# Patient Record
Sex: Female | Born: 1985 | Race: Black or African American | Hispanic: No | Marital: Single
Health system: Southern US, Community
[De-identification: ages and names within clinical notes are randomized; demographics above are authoritative.]

## PROBLEM LIST (undated history)

## (undated) DIAGNOSIS — E119 Type 2 diabetes mellitus without complications: Secondary | ICD-10-CM

## (undated) HISTORY — PX: NO PAST SURGERIES: SHX2092

---

## 2015-09-27 ENCOUNTER — Inpatient Hospital Stay (HOSPITAL_COMMUNITY): Payer: Self-pay

## 2015-09-27 ENCOUNTER — Inpatient Hospital Stay (HOSPITAL_COMMUNITY)
Admission: AD | Admit: 2015-09-27 | Discharge: 2015-09-27 | Disposition: A | Discharge status: Home / Self Care | Payer: 59 | Source: Ambulatory Visit | Attending: Obstetrics and Gynecology | Admitting: Obstetrics and Gynecology

## 2015-09-27 ENCOUNTER — Encounter (HOSPITAL_COMMUNITY): Payer: Self-pay

## 2015-09-27 DIAGNOSIS — N83201 Unspecified ovarian cyst, right side: Secondary | ICD-10-CM | POA: Insufficient documentation

## 2015-09-27 DIAGNOSIS — B3749 Other urogenital candidiasis: Secondary | ICD-10-CM

## 2015-09-27 DIAGNOSIS — N760 Acute vaginitis: Secondary | ICD-10-CM | POA: Diagnosis not present

## 2015-09-27 DIAGNOSIS — B373 Candidiasis of vulva and vagina: Secondary | ICD-10-CM | POA: Diagnosis not present

## 2015-09-27 DIAGNOSIS — A499 Bacterial infection, unspecified: Secondary | ICD-10-CM

## 2015-09-27 DIAGNOSIS — IMO0001 Reserved for inherently not codable concepts without codable children: Secondary | ICD-10-CM

## 2015-09-27 DIAGNOSIS — R109 Unspecified abdominal pain: Secondary | ICD-10-CM

## 2015-09-27 DIAGNOSIS — E1165 Type 2 diabetes mellitus with hyperglycemia: Secondary | ICD-10-CM | POA: Insufficient documentation

## 2015-09-27 DIAGNOSIS — B9689 Other specified bacterial agents as the cause of diseases classified elsewhere: Secondary | ICD-10-CM

## 2015-09-27 HISTORY — DX: Type 2 diabetes mellitus without complications: E11.9

## 2015-09-27 LAB — LIPASE, BLOOD: LIPASE: 25 U/L (ref 11–51)

## 2015-09-27 LAB — URINALYSIS, ROUTINE W REFLEX MICROSCOPIC
BILIRUBIN URINE: NEGATIVE
Glucose, UA: >1000 mg/dL — AB
KETONES UR: 15 mg/dL — AB
Leukocytes, UA: NEGATIVE
NITRITE: NEGATIVE
PH: 6 (ref 5.0–8.0)
Protein, ur: NEGATIVE mg/dL
Specific Gravity, Urine: 1.01 (ref 1.005–1.030)

## 2015-09-27 LAB — CBC WITH DIFFERENTIAL/PLATELET
Basophils Absolute: 0 10*3/uL (ref 0.0–0.1)
Basophils Relative: 0 %
EOS PCT: 0 %
Eosinophils Absolute: 0 10*3/uL (ref 0.0–0.7)
HCT: 38.8 % (ref 36.0–46.0)
HEMOGLOBIN: 12.8 g/dL (ref 12.0–15.0)
LYMPHS ABS: 2.2 10*3/uL (ref 0.7–4.0)
LYMPHS PCT: 28 %
MCH: 30.7 pg (ref 26.0–34.0)
MCHC: 33 g/dL (ref 30.0–36.0)
MCV: 93 fL (ref 78.0–100.0)
Monocytes Absolute: 0.5 10*3/uL (ref 0.1–1.0)
Monocytes Relative: 6 %
NEUTROS PCT: 66 %
Neutro Abs: 5.1 10*3/uL (ref 1.7–7.7)
Platelets: 291 10*3/uL (ref 150–400)
RBC: 4.17 MIL/uL (ref 3.87–5.11)
RDW: 13.1 % (ref 11.5–15.5)
WBC: 7.7 10*3/uL (ref 4.0–10.5)

## 2015-09-27 LAB — COMPREHENSIVE METABOLIC PANEL
ALK PHOS: 41 U/L (ref 38–126)
ALT: 13 U/L — AB (ref 14–54)
AST: 12 U/L — ABNORMAL LOW (ref 15–41)
Albumin: 4 g/dL (ref 3.5–5.0)
Anion gap: 4 — ABNORMAL LOW (ref 5–15)
BUN: 14 mg/dL (ref 6–20)
CALCIUM: 9.1 mg/dL (ref 8.9–10.3)
CO2: 26 mmol/L (ref 22–32)
Chloride: 107 mmol/L (ref 101–111)
Creatinine, Ser: 0.62 mg/dL (ref 0.44–1.00)
Glucose, Bld: 368 mg/dL — ABNORMAL HIGH (ref 65–99)
Potassium: 4.5 mmol/L (ref 3.5–5.1)
Sodium: 137 mmol/L (ref 135–145)
Total Bilirubin: 0.7 mg/dL (ref 0.3–1.2)
Total Protein: 7.5 g/dL (ref 6.5–8.1)

## 2015-09-27 LAB — URINE MICROSCOPIC-ADD ON

## 2015-09-27 LAB — WET PREP, GENITAL
Sperm: NONE SEEN
TRICH WET PREP: NONE SEEN
YEAST WET PREP: NONE SEEN

## 2015-09-27 LAB — AMYLASE: Amylase: 100 U/L (ref 28–100)

## 2015-09-27 LAB — POCT PREGNANCY, URINE: Preg Test, Ur: NEGATIVE

## 2015-09-27 MED ORDER — OXYCODONE-ACETAMINOPHEN 5-325 MG PO TABS: 1.0000 | ORAL_TABLET | Freq: Four times a day (QID) | ORAL | Status: DC | PRN

## 2015-09-27 MED ORDER — OXYCODONE-ACETAMINOPHEN 5-325 MG PO TABS: 1.0000 | ORAL_TABLET | Freq: Four times a day (QID) | ORAL | Status: AC | PRN

## 2015-09-27 MED ORDER — FLUCONAZOLE 150 MG PO TABS: ORAL_TABLET | ORAL | Status: AC

## 2015-09-27 MED ORDER — ACETAMINOPHEN 500 MG PO TABS: 1000.0000 mg | ORAL_TABLET | Freq: Once | ORAL | Status: DC

## 2015-09-27 MED ORDER — METRONIDAZOLE 500 MG PO TABS: 500.0000 mg | ORAL_TABLET | Freq: Two times a day (BID) | ORAL | Status: AC

## 2015-09-27 NOTE — Discharge Instructions (Signed)
Disposable Sitz Bath A disposable sitz bath is a plastic basin that fits over the toilet. A bag is hung above the toilet and is connected to a tube that opens into the disposable sitz bath. The bag is filled with warm water that can flow into the basin through the tube.  HOW TO USE A DISPOSABLE SITZ BATH 1. Close the clamp on the tubing before filling the bag with water. This is to prevent leakage. 2. Fill the sitz bath basin and the plastic bag with warm water. 3. Place the filled basin on the toilet with the seat raised. Make sure the overflow opening is facing toward the back of the toilet. 4. Hang the filled plastic bag overhead on a hook or towel rack close to the toilet. When the bag is unclamped, a steady stream of water will flow from the bag, through the tubing, and into the basin. 5. Attach the tubing to the opening on the basin. 6. Sit on the basin positioned on the toilet seat and release the clamp. This will allow warm water to flush the area around your genitals and anus (perineum). 7. Remain sitting on the basin for approximately 15 to 20 minutes. 8. Stand up and pat the perineum area dry. If needed, apply clean bandages (dressings) to the affected area. 9. Tip the basin into the toilet to remove any remaining water and flush the toilet. 10. Wash the basin with warm water and soap. Let it dry in the sink. 11. Store the basin and tubing in a clean, dry area. 12. Wash your hands with soap and water. SEEK MEDICAL CARE IF: You get worse instead of better. Stop the sitz baths if you get worse. MAKE SURE YOU:  Understand these instructions.  Will watch your condition.  Will get help right away if you are not doing well or get worse.   This information is not intended to replace advice given to you by your health care provider. Make sure you discuss any questions you have with your health care provider.   Document Released: 11/15/2011 Document Revised: 02/08/2012 Document Reviewed:  11/15/2011 Elsevier Interactive Patient Education 2016 Elsevier Inc. Bacterial Vaginosis Bacterial vaginosis is a vaginal infection that occurs when the normal balance of bacteria in the vagina is disrupted. It results from an overgrowth of certain bacteria. This is the most common vaginal infection in women of childbearing age. Treatment is important to prevent complications, especially in pregnant women, as it can cause a premature delivery. CAUSES  Bacterial vaginosis is caused by an increase in harmful bacteria that are normally present in smaller amounts in the vagina. Several different kinds of bacteria can cause bacterial vaginosis. However, the reason that the condition develops is not fully understood. RISK FACTORS Certain activities or behaviors can put you at an increased risk of developing bacterial vaginosis, including: 30. Having a new sex partner or multiple sex partners. 14. Douching. 15. Using an intrauterine device (IUD) for contraception. Women do not get bacterial vaginosis from toilet seats, bedding, swimming pools, or contact with objects around them. SIGNS AND SYMPTOMS  Some women with bacterial vaginosis have no signs or symptoms. Common symptoms include:  Grey vaginal discharge.  A fishlike odor with discharge, especially after sexual intercourse.  Itching or burning of the vagina and vulva.  Burning or pain with urination. DIAGNOSIS  Your health care provider will take a medical history and examine the vagina for signs of bacterial vaginosis. A sample of vaginal fluid may be taken.  Your health care provider will look at this sample under a microscope to check for bacteria and abnormal cells. A vaginal pH test may also be done.  TREATMENT  Bacterial vaginosis may be treated with antibiotic medicines. These may be given in the form of a pill or a vaginal cream. A second round of antibiotics may be prescribed if the condition comes back after treatment. Because  bacterial vaginosis increases your risk for sexually transmitted diseases, getting treated can help reduce your risk for chlamydia, gonorrhea, HIV, and herpes. HOME CARE INSTRUCTIONS   Only take over-the-counter or prescription medicines as directed by your health care provider.  If antibiotic medicine was prescribed, take it as directed. Make sure you finish it even if you start to feel better.  Tell all sexual partners that you have a vaginal infection. They should see their health care provider and be treated if they have problems, such as a mild rash or itching.  During treatment, it is important that you follow these instructions:  Avoid sexual activity or use condoms correctly.  Do not douche.  Avoid alcohol as directed by your health care provider.  Avoid breastfeeding as directed by your health care provider. SEEK MEDICAL CARE IF:   Your symptoms are not improving after 3 days of treatment.  You have increased discharge or pain.  You have a fever. MAKE SURE YOU:   Understand these instructions.  Will watch your condition.  Will get help right away if you are not doing well or get worse. FOR MORE INFORMATION  Centers for Disease Control and Prevention, Division of STD Prevention: [employer] American Sexual Health Association (ASHA): www.ashastd.org    This information is not intended to replace advice given to you by your health care provider. Make sure you discuss any questions you have with your health care provider.   Document Released: 05/16/2005 Document Revised: 06/06/2014 Document Reviewed: 12/26/2012 Elsevier Interactive Patient Education 2016 Elsevier Inc. Ovarian Tumors The ovaries are small organs that produce eggs in women. They lie on each side of the uterus. Tumors are solid growths on the ovary, not like ovarian cysts that are filled with fluid. They can be cancerous or noncancerous. All solid tumors should be looked at to make sure they are not  cancerous tumors.  RISK FACTORS There are no known causes for ovarian tumors. However, there are several risk factors for developing cancerous tumors on the ovary, such as: 45. Age. 30. [place] or [place] descent. 73. Personal or family history of ovarian, colon, or breast cancer. 19. Presence of BRCA1 or BRCA2 genes. 20. Use of fertility medicines. 21. Late menopause (older than 50 years). 14. Pregnancy for the first time at age 25 years or older. SIGNS AND SYMPTOMS  In many cases there are no symptoms. Noncancerous tumors usually have no symptoms, but cancerous tumors may have symptoms that are minor and resemble other health problems. The following are symptoms of ovarian tumors:  Unexplained weight loss.  Increased abdominal size.  Belly (abdominal) pain.  Back or pelvis pain or pressure.  Tiredness.  Abnormal vaginal bleeding.  Loss of appetite.  Frequent urination or pressure on your bladder.  Indigestion, increased gas, and bloating.  Painful sexual intercourse. DIAGNOSIS  The following test may be done to help diagnose ovarian tumors:  An ultrasound exam.  Imaging exams, such as an X-ray exam, CT scan, or MRI.  Blood tests. TREATMENT   The tumor will be studied in the lab under a microscope to  see if it is cancer.  Noncancerous tumors can be removed surgically with or without removing the ovary.  Cancerous tumors usually are removed with the ovary and sometimes both ovaries are removed with the fallopian tubes, uterus, and surrounding lymph nodes to see if the cancer has spread.  Cancerous tumors may also be treated along with surgery and radiation, chemotherapy, or both. HOME CARE INSTRUCTIONS   Follow your health care provider's advice and recommendations regarding medicine and follow-up care.  Get a yearly physical and gynecology exam. This includes a pelvic exam if you are aged 22 years or older. SEEK MEDICAL CARE IF:   You have any  of the above symptoms that have not gone away after a week of treatment.  You are losing weight for no reason.  You feel generally ill.   This information is not intended to replace advice given to you by your health care provider. Make sure you discuss any questions you have with your health care provider.   Document Released: 02/23/2008 Document Revised: 03/06/2013 Document Reviewed: 12/13/2012 Elsevier Interactive Patient Education [employer].

## 2015-09-27 NOTE — MAU Provider Note (Signed)
History     CSN: [id number]  Arrival date and time: 09/27/15 [id number]   First Provider Initiated Contact with Patient 09/27/15 1948      Chief Complaint  Patient presents with  . Abdominal Pain   HPI Comments: Paige Gonzalez is a 30 y.o. diabetic nulligravida presenting with right lower abdominal pain. [place] and has not established care here. She has a stressful new job. Not taking her insulin due to financial problems, but is taking metformin (unsure of dose "4 tabs twice a day"). Not checking blood sugars. Unsure of insulin dose and has not taken it for 2 months or more.  Gets frequent yeast infections and thinks she has a yeast infection now. LMP 09/20/15, now on last day of menses.    Abdominal Pain This is a new problem. The current episode started in the past 7 days (3 days ago began with sharp intermittent right mid and lower abdominal pains). The onset quality is gradual. The problem occurs intermittently. The problem has been gradually worsening. The pain is located in the RLQ and periumbilical region (pain has moved to suprapubic abdomen). The pain is at a severity of 9/10. The pain is moderate. The quality of the pain is sharp and cramping. The abdominal pain does not radiate. Associated symptoms include anorexia, frequency and vomiting. Pertinent negatives include no constipation, diarrhea, dysuria, fever or nausea. Associated symptoms comments: Vomited once after eating lunch today and has not eaten since. . The pain is aggravated by movement. She has tried nothing for the symptoms. There is no history of abdominal surgery.     Past Medical History  Diagnosis Date  . Diabetes mellitus without complication (HCC)     metformin    Past Surgical History  Procedure Laterality Date  . No past surgeries      History reviewed. No pertinent family history.  Social History  Substance Use Topics  . Smoking status: None  . Smokeless tobacco:  None  . Alcohol Use: None    Allergies: No Known Allergies  No prescriptions prior to admission    Review of Systems  Constitutional: Negative for fever.  Gastrointestinal: Positive for vomiting, abdominal pain and anorexia. Negative for nausea, diarrhea and constipation.  Genitourinary: Positive for frequency. Negative for dysuria.   Physical Exam   Blood pressure 114/79, pulse 87, temperature 98.4 F (36.9 C), temperature source Oral, resp. rate 20, height 5\' 11"  (1.803 m), weight 107.956 kg (238 lb), last menstrual period 09/20/2015, SpO2 99 %.  Physical Exam  Nursing note and vitals reviewed. Constitutional: She is oriented to person, place, and time. She appears well-developed and well-nourished. She appears distressed.  HENT:  Head: Normocephalic.  Eyes: Conjunctivae are normal.  Neck: Normal range of motion. Neck supple. No thyromegaly present.  Cardiovascular: Normal rate and regular rhythm.   No murmur heard. Respiratory: Effort normal and breath sounds normal. No respiratory distress.  GI: Soft. She exhibits no distension and no mass. There is tenderness. There is rebound and guarding.  BS diminished in lower quadrants   Genitourinary: Vagina normal. No vaginal discharge found.  Pelvic exam: Vulvar and perineal pink confluent rash, no erythema or swelling Vagina with scant white discharge, slight amount dark blood on Q-tip Cx nulliparous, clean Bimanual exam poorly tolerated; CMT not noted, unable to outliine uterus or palpate ovaries due to guarding  Musculoskeletal: She exhibits no edema.  Neurological: She is alert and oriented to person, place, and time.  Skin:  Skin is warm and dry.  Psychiatric: She has a normal mood and affect. Her behavior is normal. Thought content normal.    MAU Course  Procedures Results for orders placed or performed during the hospital encounter of 09/27/15 (from the past 24 hour(s))  Urinalysis, Routine w reflex microscopic (not at  [facility])     Status: Abnormal   Collection Time: 09/27/15  7:25 PM  Result Value Ref Range   Color, Urine YELLOW YELLOW   APPearance CLEAR CLEAR   Specific Gravity, Urine 1.010 1.005 - 1.030   pH 6.0 5.0 - 8.0   Glucose, UA >1000 (A) NEGATIVE mg/dL   Hgb urine dipstick LARGE (A) NEGATIVE   Bilirubin Urine NEGATIVE NEGATIVE   Ketones, ur 15 (A) NEGATIVE mg/dL   Protein, ur NEGATIVE NEGATIVE mg/dL   Nitrite NEGATIVE NEGATIVE   Leukocytes, UA NEGATIVE NEGATIVE  Urine microscopic-add on     Status: Abnormal   Collection Time: 09/27/15  7:25 PM  Result Value Ref Range   Squamous Epithelial / LPF 0-5 (A) NONE SEEN   WBC, UA 0-5 0 - 5 WBC/hpf   RBC / HPF 6-30 0 - 5 RBC/hpf   Bacteria, UA FEW (A) NONE SEEN  Pregnancy, urine POC     Status: None   Collection Time: 09/27/15  7:37 PM  Result Value Ref Range   Preg Test, Ur NEGATIVE NEGATIVE  Wet prep, genital     Status: Abnormal   Collection Time: 09/27/15  8:05 PM  Result Value Ref Range   Yeast Wet Prep HPF POC NONE SEEN NONE SEEN   Trich, Wet Prep NONE SEEN NONE SEEN   Clue Cells Wet Prep HPF POC PRESENT (A) NONE SEEN   WBC, Wet Prep HPF POC MODERATE (A) NONE SEEN   Sperm NONE SEEN   CBC with Differential/Platelet     Status: None   Collection Time: 09/27/15  8:14 PM  Result Value Ref Range   WBC 7.7 4.0 - 10.5 K/uL   RBC 4.17 3.87 - 5.11 MIL/uL   Hemoglobin 12.8 12.0 - 15.0 g/dL   HCT 81.1 91.4 - 78.2 %   MCV 93.0 78.0 - 100.0 fL   MCH 30.7 26.0 - 34.0 pg   MCHC 33.0 30.0 - 36.0 g/dL   RDW 95.6 21.3 - 08.6 %   Platelets 291 150 - 400 K/uL   Neutrophils Relative % 66 %   Neutro Abs 5.1 1.7 - 7.7 K/uL   Lymphocytes Relative 28 %   Lymphs Abs 2.2 0.7 - 4.0 K/uL   Monocytes Relative 6 %   Monocytes Absolute 0.5 0.1 - 1.0 K/uL   Eosinophils Relative 0 %   Eosinophils Absolute 0.0 0.0 - 0.7 K/uL   Basophils Relative 0 %   Basophils Absolute 0.0 0.0 - 0.1 K/uL  Comprehensive metabolic panel     Status: Abnormal   Collection  Time: 09/27/15  8:14 PM  Result Value Ref Range   Sodium 137 135 - 145 mmol/L   Potassium 4.5 3.5 - 5.1 mmol/L   Chloride 107 101 - 111 mmol/L   CO2 26 22 - 32 mmol/L   Glucose, Bld 368 (H) 65 - 99 mg/dL   BUN 14 6 - 20 mg/dL   Creatinine, Ser 5.78 0.44 - 1.00 mg/dL   Calcium 9.1 8.9 - 46.9 mg/dL   Total Protein 7.5 6.5 - 8.1 g/dL   Albumin 4.0 3.5 - 5.0 g/dL   AST 12 (L) 15 - 41 U/L  ALT 13 (L) 14 - 54 U/L   Alkaline Phosphatase 41 38 - 126 U/L   Total Bilirubin 0.7 0.3 - 1.2 mg/dL   GFR calc non Af Amer >60 >60 mL/min   GFR calc Af Amer >60 >60 mL/min   Anion gap 4 (L) 5 - 15  Lipase, blood     Status: None   Collection Time: 09/27/15  8:14 PM  Result Value Ref Range   Lipase 25 11 - 51 U/L  Amylase     Status: None   Collection Time: 09/27/15  8:14 PM  Result Value Ref Range   Amylase 100 28 - 100 U/L  US Transvaginal Non-ob  09/27/2015  CLINICAL DATA:  Pelvic pain. EXAM: TRANSABDOMINAL AND TRANSVAGINAL ULTRASOUND OF PELVIS TECHNIQUE: Both transabdominal and transvaginal ultrasound examinations of the pelvis were performed. Transabdominal technique was performed for global imaging of the pelvis including uterus, ovaries, adnexal regions, and pelvic cul-de-sac. It was necessary to proceed with endovaginal exam following the transabdominal exam to visualize the endometrium and ovaries. COMPARISON:  None FINDINGS: Uterus Measurements: 7.3 x 4.9 x 3.1 cm. No fibroids or other mass visualized. Endometrium Thickness: 6.6 mm which is within normal limits for patient of reproductive age. No focal abnormality visualized. Right ovary Measurements: 6.3 x 4.2 x 3.9 cm. Complex lesion is noted in right ovary measuring 5.0 x 3.7 x 3.5 cm. Left ovary Measurements: 4.0 x 3.2 x 0.9 cm. Normal appearance/no adnexal mass. Other findings No abnormal free fluid. IMPRESSION: 5 cm complex lesion seen in right ovary which may represent hemorrhagic cyst or endometrioma. Follow-up ultrasound in 6-12 weeks is  recommended to ensure resolution and rule out neoplasm. Electronically Signed   By: Lupita Raider, M.D.   On: 09/27/2015 21:04   US Pelvis Complete  09/27/2015  CLINICAL DATA:  Pelvic pain. EXAM: TRANSABDOMINAL AND TRANSVAGINAL ULTRASOUND OF PELVIS TECHNIQUE: Both transabdominal and transvaginal ultrasound examinations of the pelvis were performed. Transabdominal technique was performed for global imaging of the pelvis including uterus, ovaries, adnexal regions, and pelvic cul-de-sac. It was necessary to proceed with endovaginal exam following the transabdominal exam to visualize the endometrium and ovaries. COMPARISON:  None FINDINGS: Uterus Measurements: 7.3 x 4.9 x 3.1 cm. No fibroids or other mass visualized. Endometrium Thickness: 6.6 mm which is within normal limits for patient of reproductive age. No focal abnormality visualized. Right ovary Measurements: 6.3 x 4.2 x 3.9 cm. Complex lesion is noted in right ovary measuring 5.0 x 3.7 x 3.5 cm. Left ovary Measurements: 4.0 x 3.2 x 0.9 cm. Normal appearance/no adnexal mass. Other findings No abnormal free fluid. IMPRESSION: 5 cm complex lesion seen in right ovary which may represent hemorrhagic cyst or endometrioma. Follow-up ultrasound in 6-12 weeks is recommended to ensure resolution and rule out neoplasm. Electronically Signed   By: Lupita Raider, M.D.   On: 09/27/2015 21:04    Declined analgesic while in MAU   Pt elects to leave before I could discuss with Dr. Emelda Fear who is in OR.      Assessment and Plan   1. Uncontrolled diabetes mellitus type 2 without complications, unspecified long term insulin use status (HCC)   2. Abdominal pain, acute   3. BV (bacterial vaginosis)   4. Candida infection of genital region   5. Cyst of right ovary    Follow-up Information    Follow up with [facility]. Schedule an appointment as soon as possible for a visit in 1  week.   Contact information:   [address]  [place] [address] [phone]      Follow up with [facility].   Specialty:  Obstetrics and Gynecology   Why:  Someone from Clinic will call you with appt.   Contact information:   [address] [place] [address] [phone]    Will go to [facility] for DM management asap Reviewed maximum acetaminophen dose 4 gm/24hr (includes Percocet) For [place] in 6wks for F/U scan. WOC for results  POE,DEIRDRE 09/27/2015, 7:49 PM

## 2015-09-27 NOTE — MAU Note (Signed)
Pt here with abdominal pain that started a couple of days ago on the lower right side, now in the lower middle of her abdomen. States her parents have a hx of kidney stones, and "wonders if that's what's wrong."

## 2015-09-28 LAB — GC/CHLAMYDIA PROBE AMP (~~LOC~~) NOT AT ARMC
CHLAMYDIA, DNA PROBE: NEGATIVE
Neisseria Gonorrhea: NEGATIVE

## 2015-09-28 LAB — HIV ANTIBODY (ROUTINE TESTING W REFLEX): HIV SCREEN 4TH GENERATION: NONREACTIVE

## 2015-09-29 ENCOUNTER — Telehealth: Payer: Self-pay | Admitting: General Practice

## 2015-09-29 DIAGNOSIS — N83201 Unspecified ovarian cyst, right side: Secondary | ICD-10-CM

## 2015-09-29 NOTE — Telephone Encounter (Signed)
Per Deirdre, patient needs u/s in 6 weeks and follow up visit in the office for ovarian cyst. Patient also needs PCP for uncontrolled diabetes. U/s scheduled for 6/12 @ 1pm. Called patient and informed her of appt and that someone from the office would contact her to schedule f/u appt in our office. Also discussed importance of PCP and provided patient several numbers for providers in the area. Patient verbalized understanding to all & had no questions

## 2015-11-09 ENCOUNTER — Ambulatory Visit (HOSPITAL_COMMUNITY): Payer: Commercial Managed Care - HMO

## 2015-11-11 ENCOUNTER — Encounter: Payer: 59 | Admitting: Obstetrics & Gynecology

## 2015-11-12 ENCOUNTER — Ambulatory Visit (HOSPITAL_COMMUNITY)
Admission: RE | Admit: 2015-11-12 | Discharge: 2015-11-12 | Disposition: A | Discharge status: Home / Self Care | Payer: Commercial Managed Care - HMO | Source: Ambulatory Visit | Attending: Obstetrics and Gynecology | Admitting: Obstetrics and Gynecology

## 2015-11-12 DIAGNOSIS — N83201 Unspecified ovarian cyst, right side: Secondary | ICD-10-CM | POA: Diagnosis present

## 2015-11-18 ENCOUNTER — Telehealth: Payer: Self-pay | Admitting: General Practice

## 2015-11-18 NOTE — Telephone Encounter (Signed)
Called patient with ultrasound results. Patient verbalized understanding & states she would like to make an appt in our office but will call back to schedule that. Patient had no questions

## 2017-10-20 IMAGING — US US PELVIS COMPLETE
1 series · 15 of 25 positions shown · non-contrast
Comparison: None

CLINICAL DATA: Pelvic pain.

EXAM:
TRANSABDOMINAL AND TRANSVAGINAL ULTRASOUND OF PELVIS
TECHNIQUE: Both transabdominal and transvaginal ultrasound examinations of the
pelvis were performed. Transabdominal technique was performed for
global imaging of the pelvis including uterus, ovaries, adnexal
regions, and pelvic cul-de-sac. It was necessary to proceed with
endovaginal exam following the transabdominal exam to visualize the
endometrium and ovaries.

[Series 1: us pelvis complete · 15 of 61 slices shown]
[im 1/61]
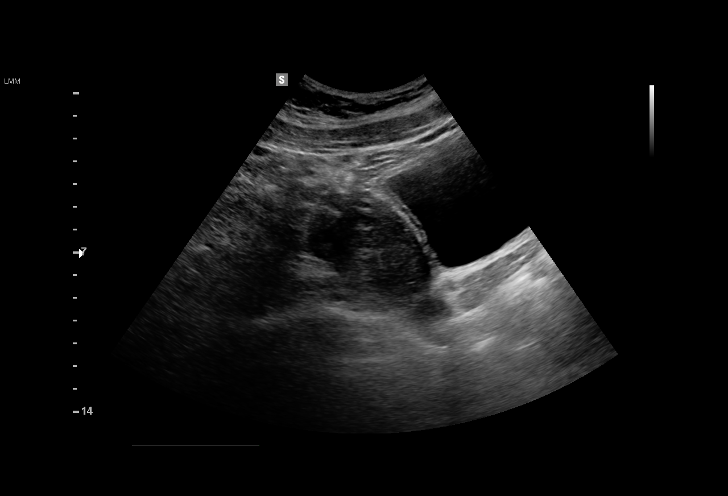
[im 6/61]
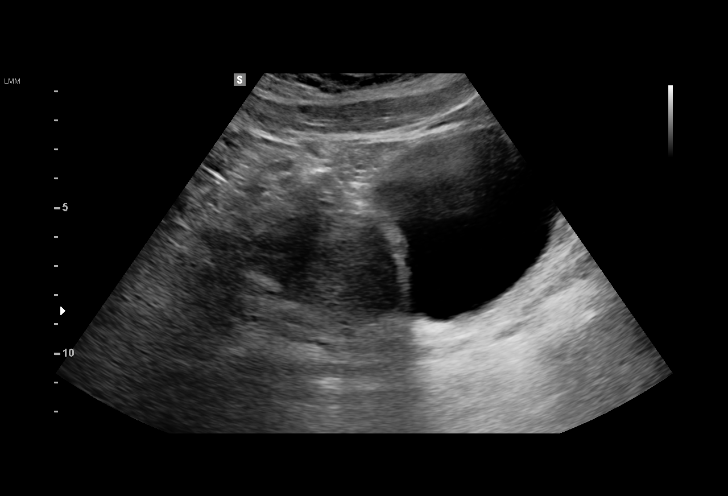
[im 11/61]
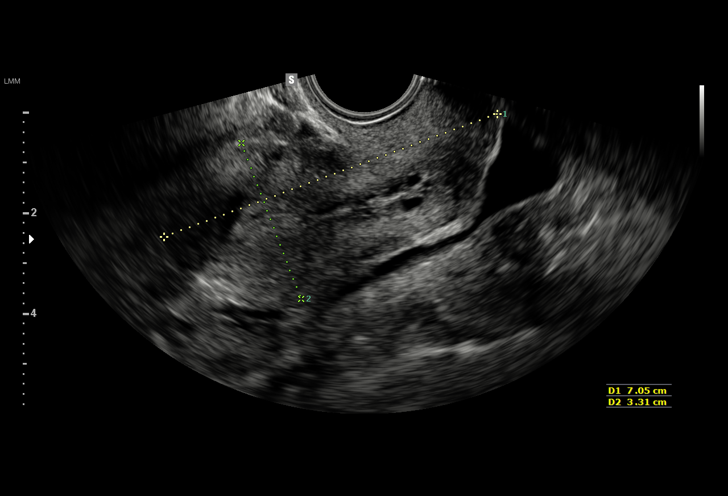
[im 13/61]
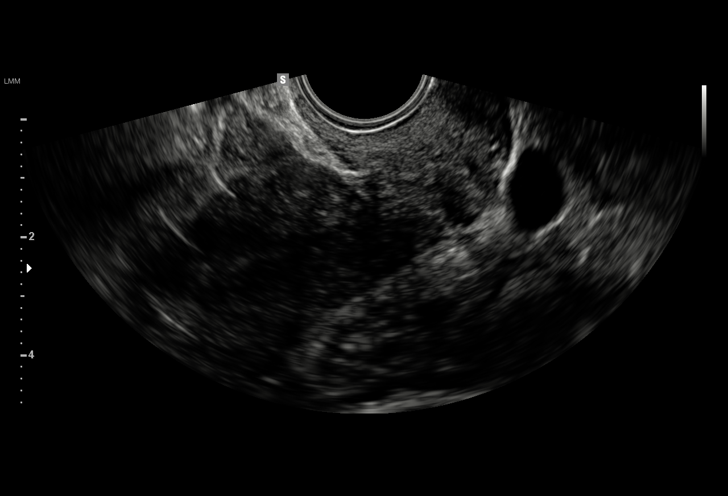
[im 18/61]
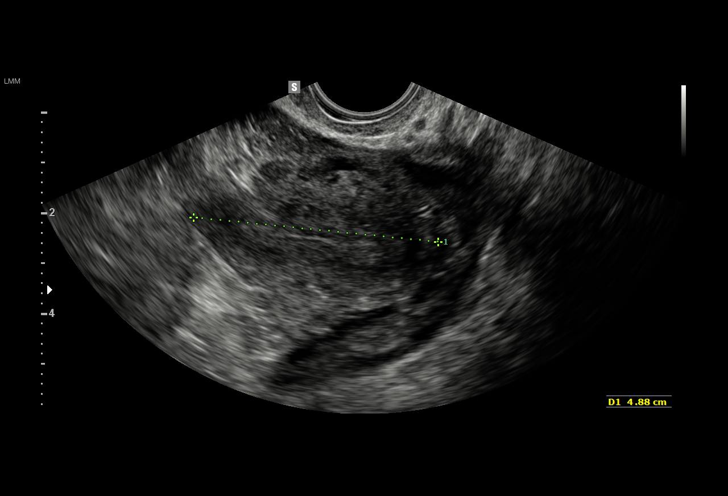
[im 23/61]
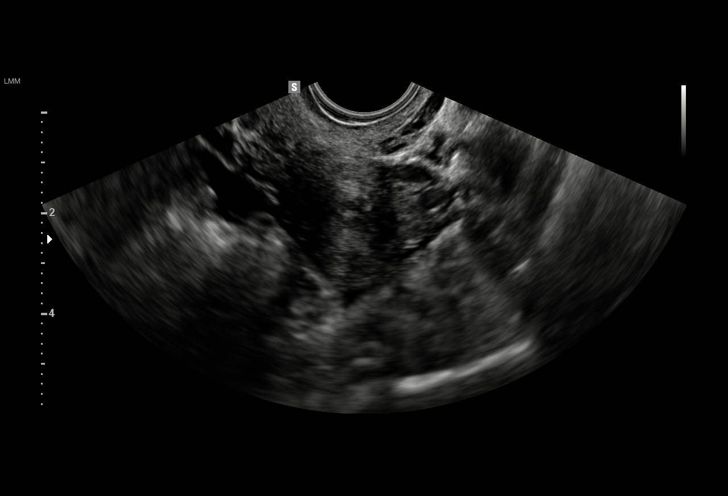
[im 26/61]
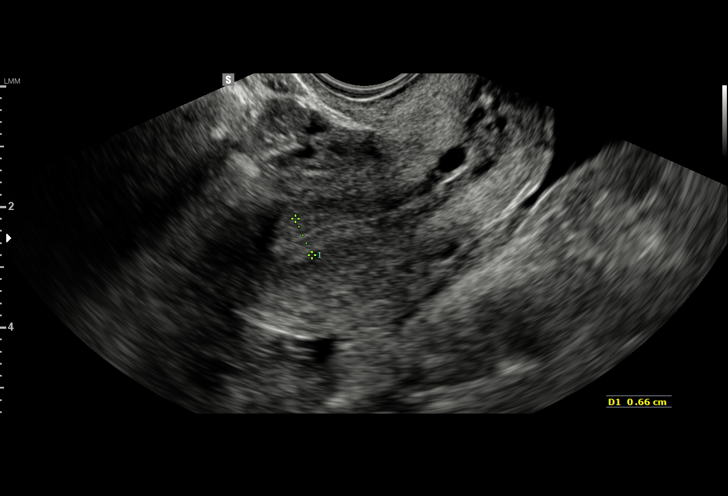
[im 31/61]
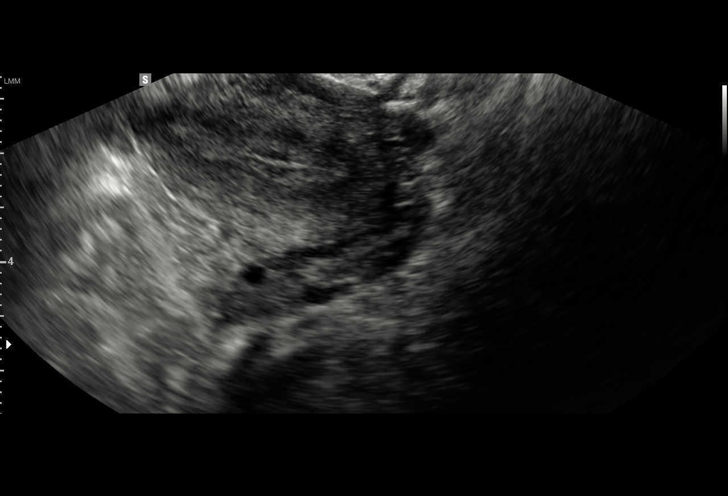
[im 36/61]
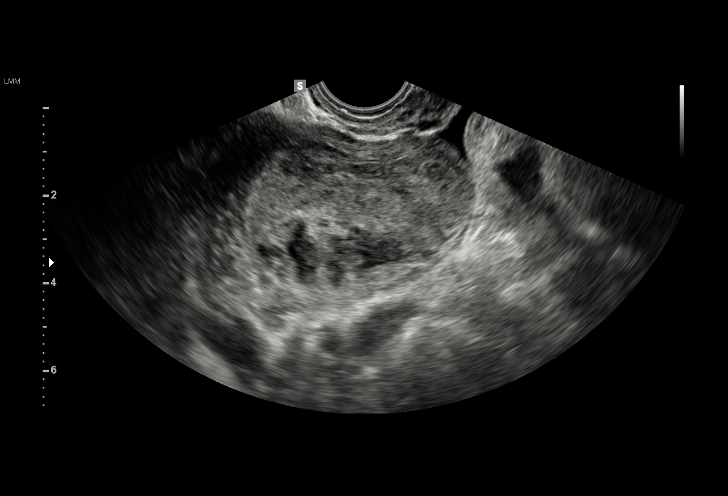
[im 38/61]
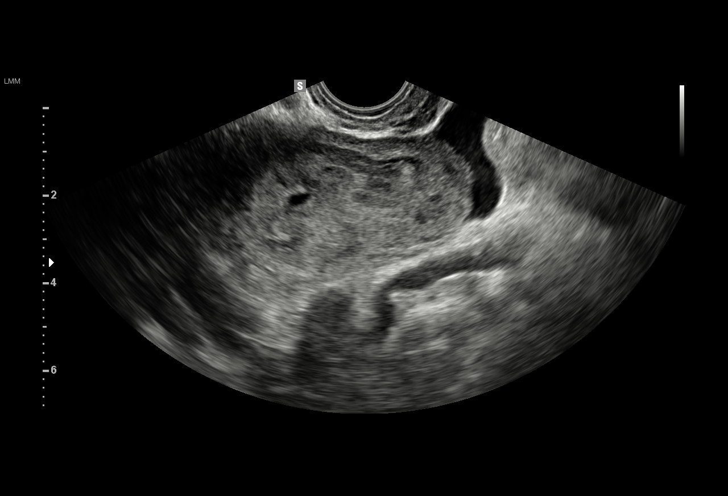
[im 43/61]
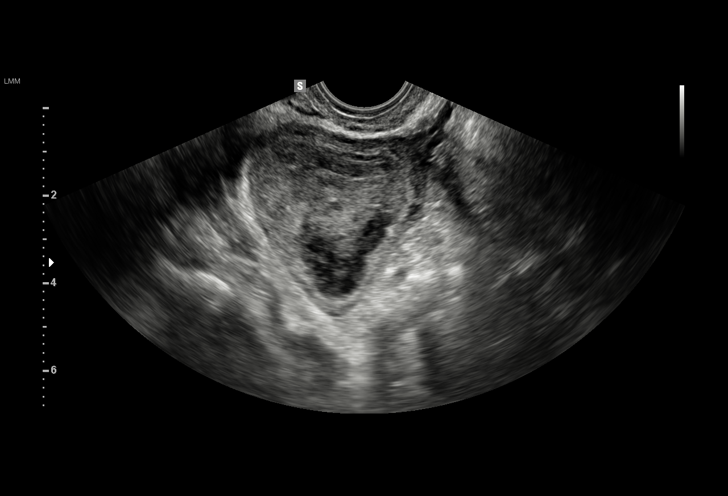
[im 48/61]
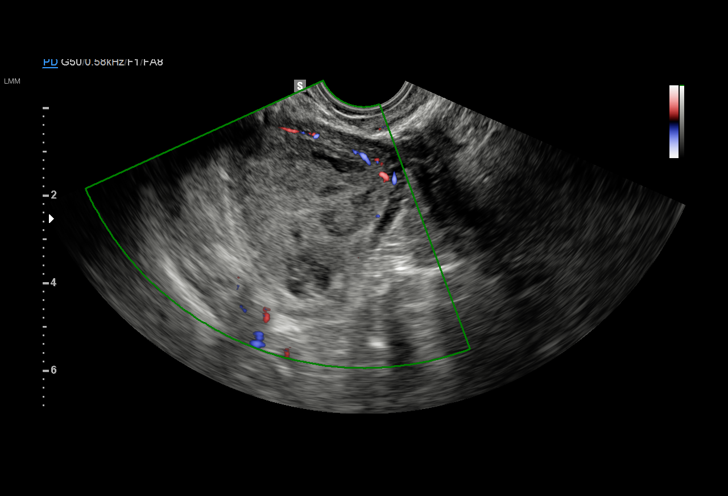
[im 51/61]
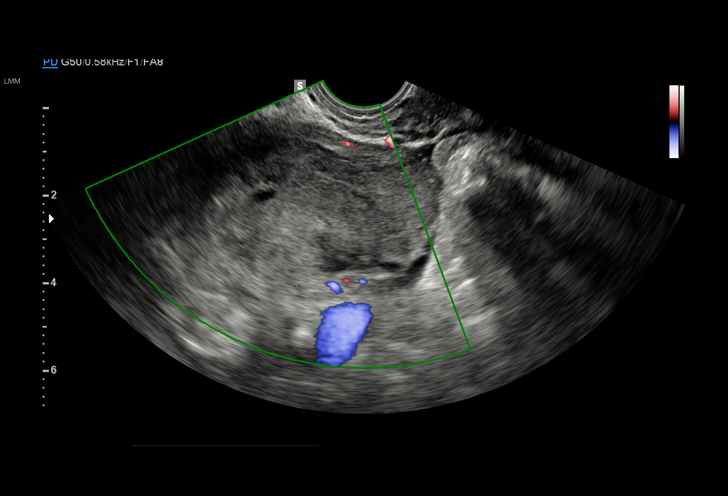
[im 56/61]
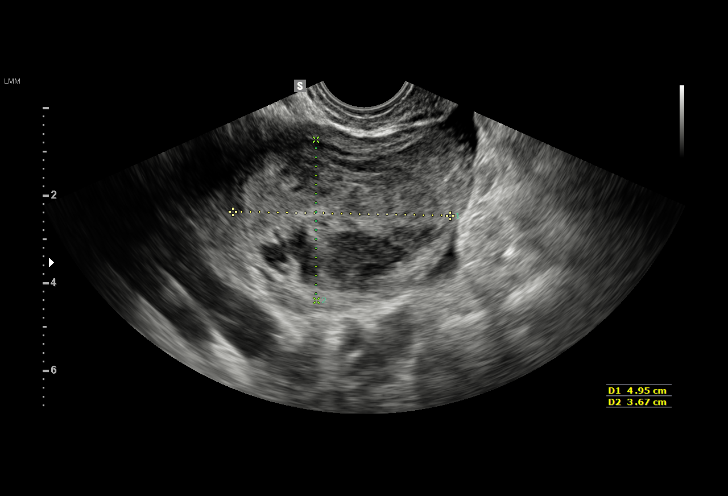
[im 61/61]
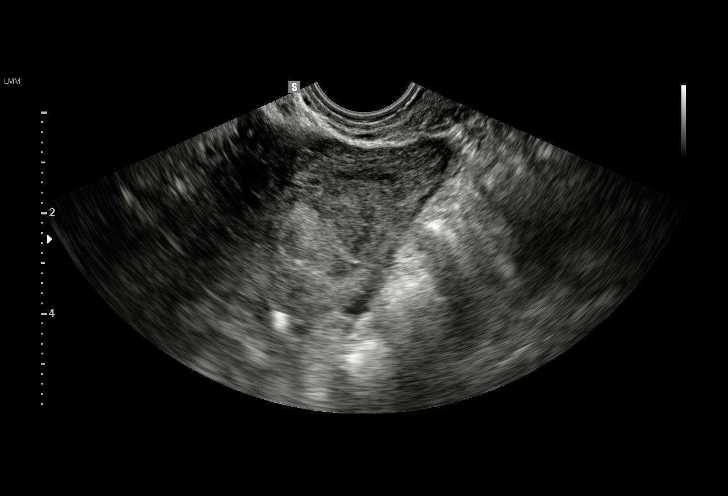

[15 of 25 positions shown; findings below may reference images not displayed]

FINDINGS: Uterus

Measurements: 7.3 x 4.9 x 3.1 cm. No fibroids or other mass
visualized.

Endometrium

Thickness: 6.6 mm which is within normal limits for patient of
reproductive age. No focal abnormality visualized.

Right ovary

Measurements: 6.3 x 4.2 x 3.9 cm. Complex lesion is noted in right
ovary measuring 5.0 x 3.7 x 3.5 cm.

Left ovary

Measurements: 4.0 x 3.2 x 0.9 cm. Normal appearance/no adnexal mass.

Other findings

No abnormal free fluid.
IMPRESSION: 5 cm complex lesion seen in right ovary which may represent
hemorrhagic cyst or endometrioma. Follow-up ultrasound in 6-12 weeks
is recommended to ensure resolution and rule out neoplasm.

## 2017-12-05 IMAGING — US US TRANSVAGINAL NON-OB
2 series · 15 of 25 positions shown · non-contrast
Comparison: 09/27/2015

CLINICAL DATA: Follow-up complex right ovarian cyst

EXAM:
TRANSABDOMINAL ULTRASOUND OF PELVIS
TECHNIQUE: Transabdominal ultrasound examination of the pelvis was performed
including evaluation of the uterus, ovaries, adnexal regions, and
pelvic cul-de-sac.

[Series 1: us transvaginal non-ob · 33 acquisitions, 14 frames shown (1 of 2)]
[im 1/33]
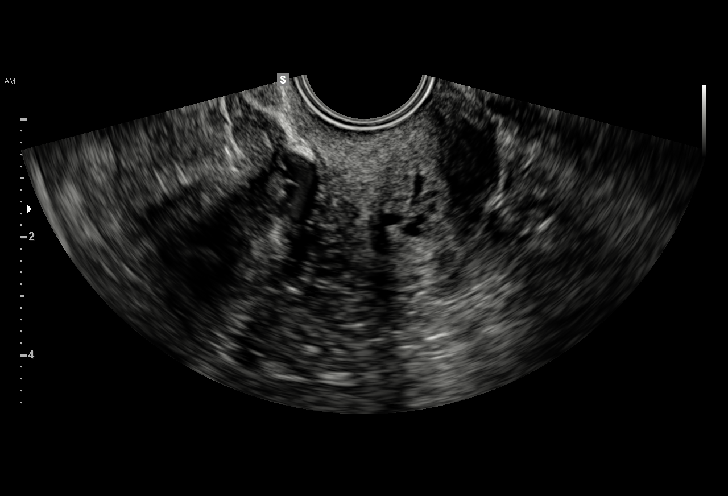
[im 3/33]
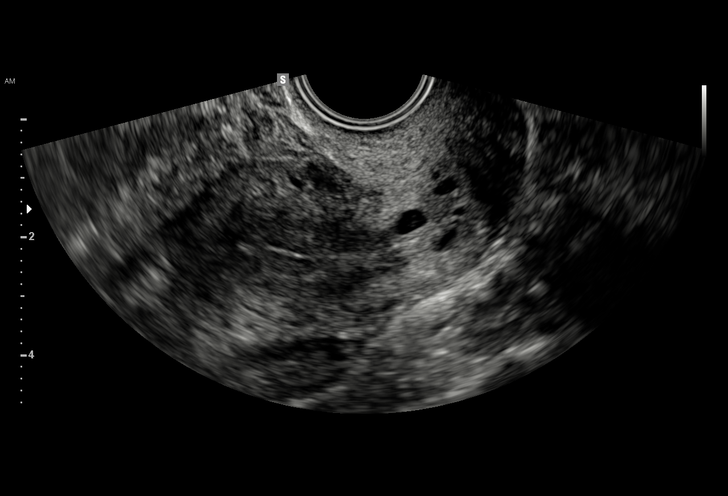
[im 6/33]
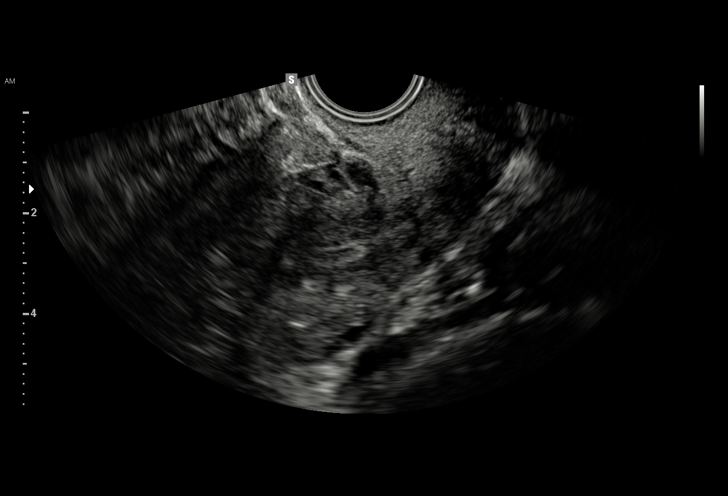
[im 8/33]
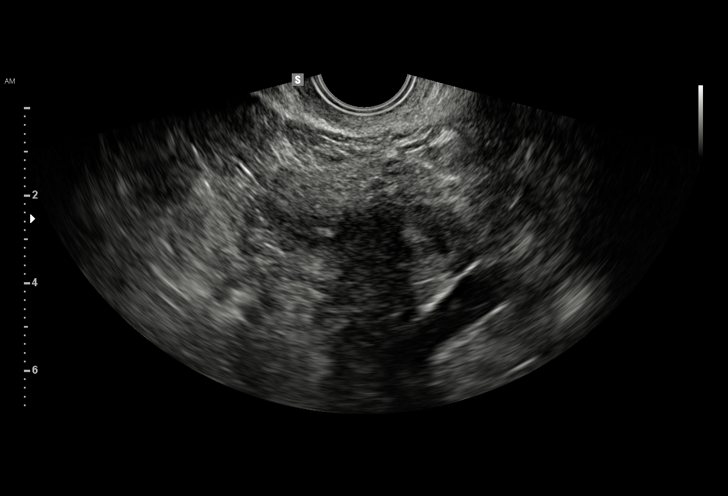
[im 11/33]
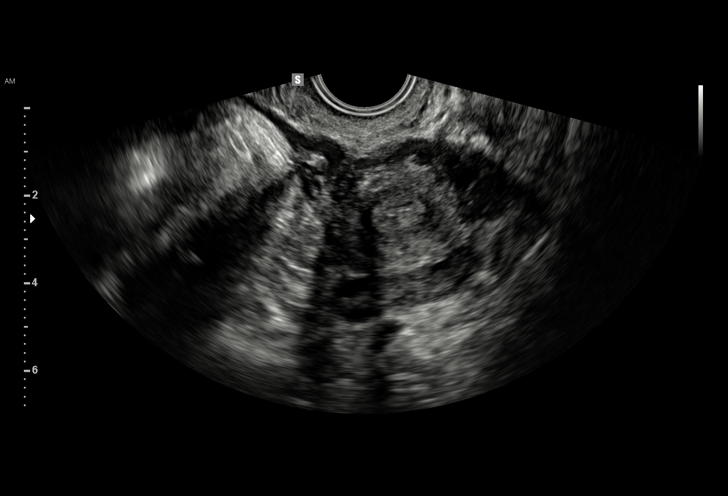
[im 14/33]
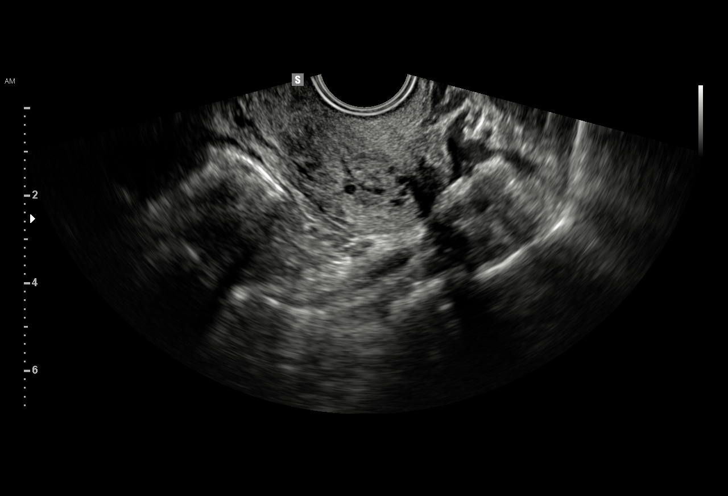
[im 15/33]
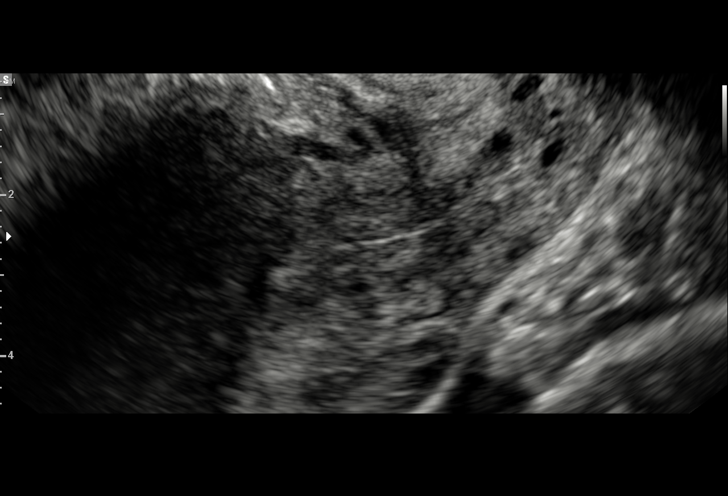
[im 18/33]
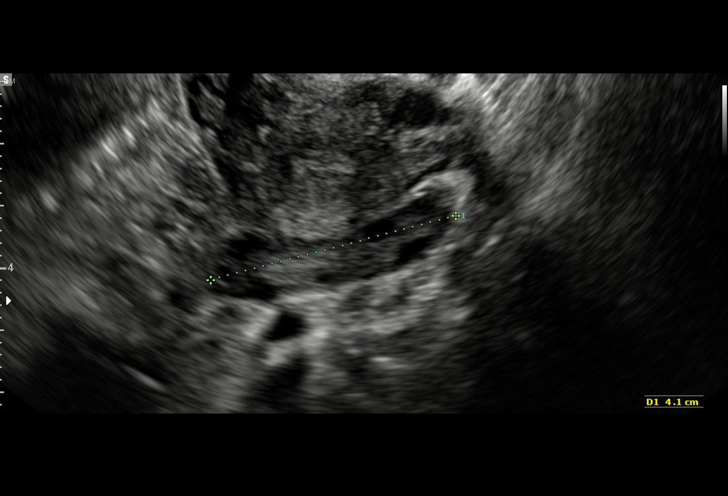
[im 21/33]
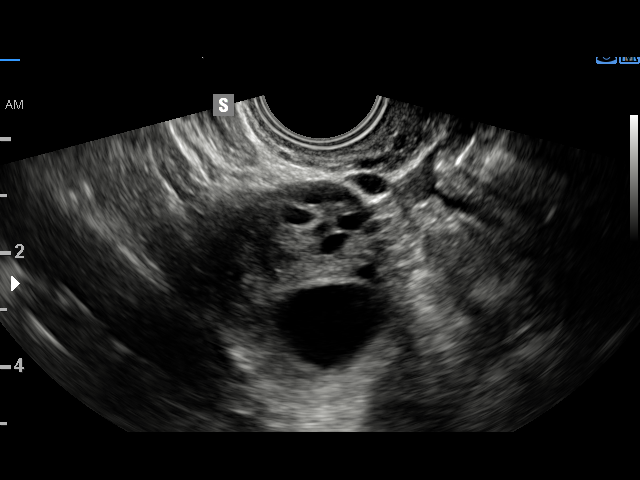
[im 22/33]
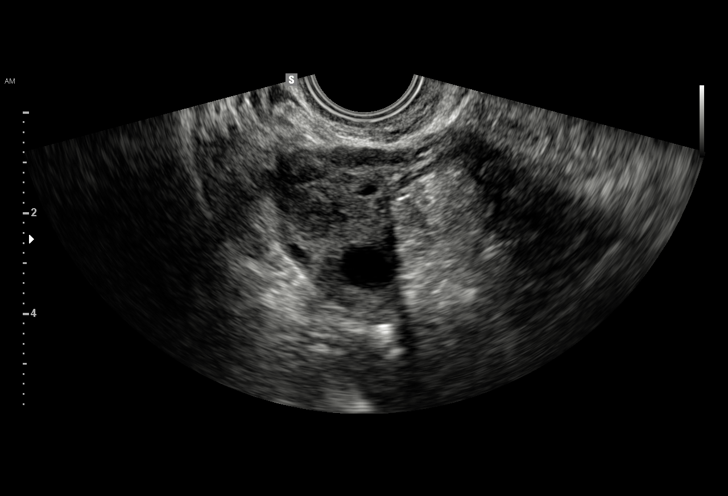
[im 25/33]
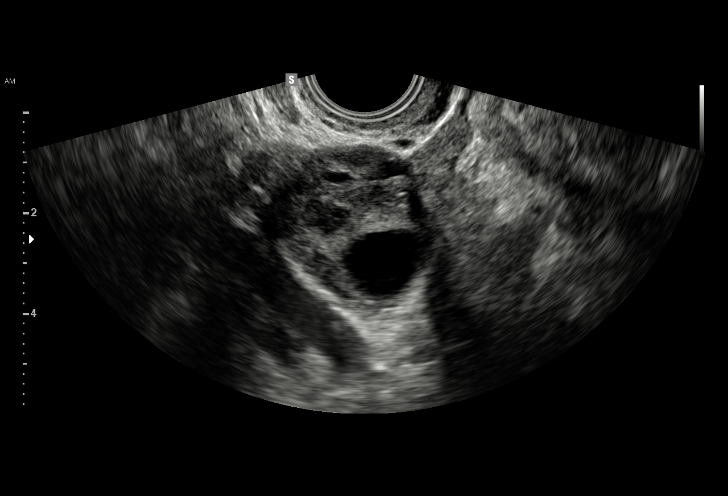
[im 28/33]
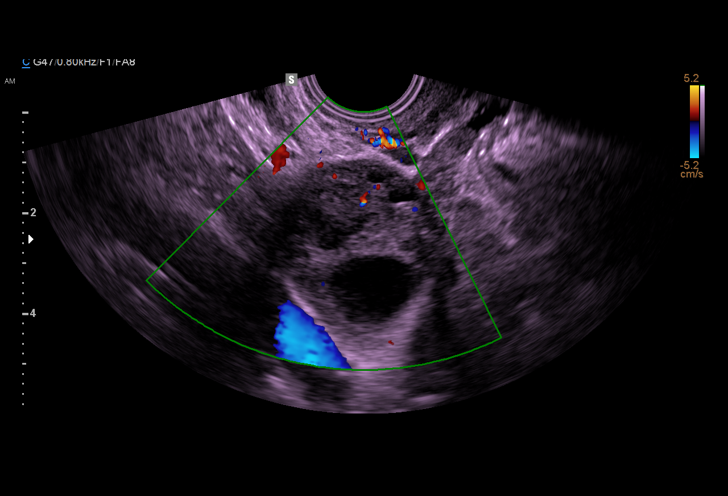
[im 30/33]
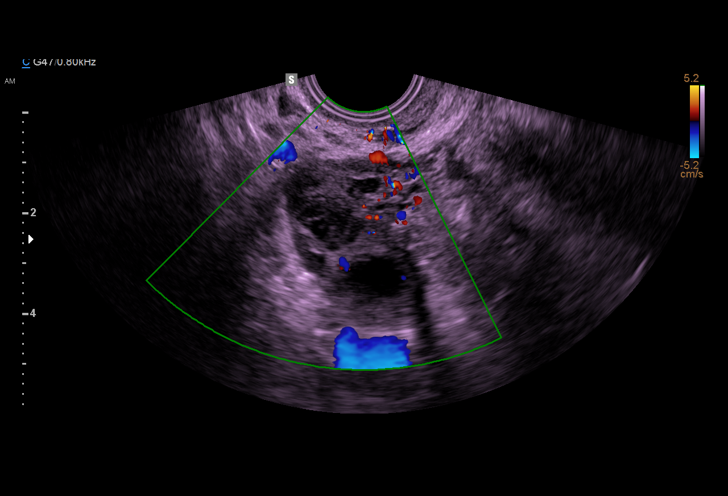
[im 33/33]
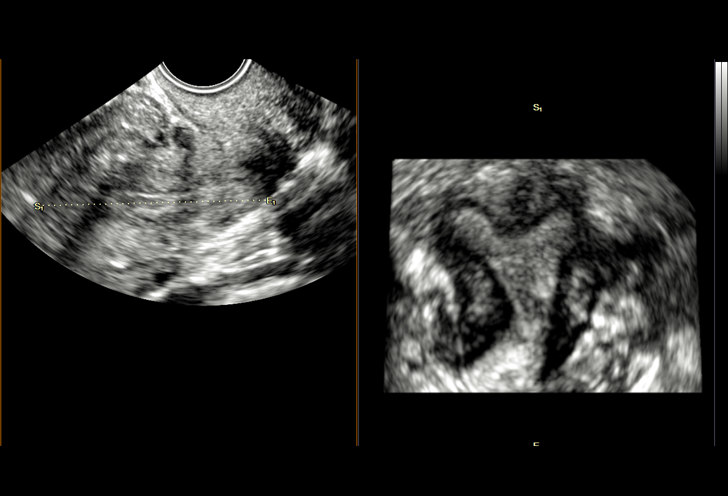

[Series 2: us transvaginal non-ob · 1 of 3 slices shown (2 of 2)]
[im 3/3]
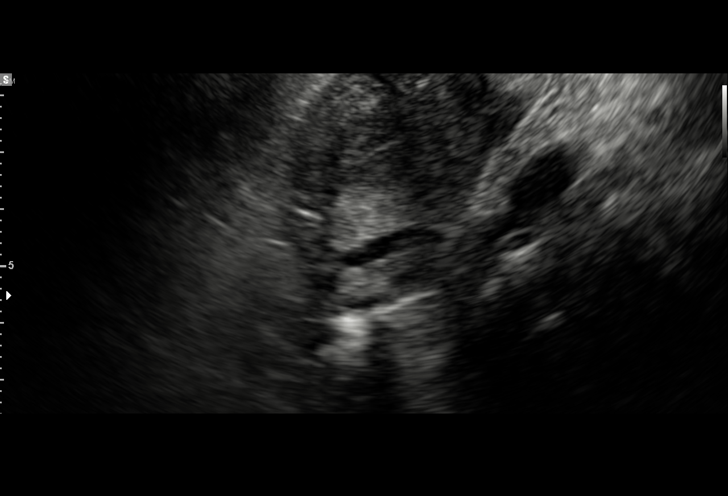

[15 of 25 positions shown; findings below may reference images not displayed]

FINDINGS: Uterus

Measurements: 7.2 x 3.0 x 5.0 cm. No fibroids or other mass
visualized.

Endometrium

Thickness: 7 mm.  No focal abnormality visualized.

Right ovary

Measurements: 3.6 x 3.5 x 2.3 cm. 2.6 x 1.9 x 1.8 cm
complex/hemorrhagic cyst, previously 5.0 x 3.7 x 3.5 cm.

Left ovary

Measurements: 2.9 x 1.3 x 4.1 cm. Normal appearance/no adnexal mass.

Other findings:  No abnormal free fluid.
IMPRESSION: 2.6 cm complex/hemorrhagic right ovarian cyst, previously 5.0 cm,
benign/physiologic.
